# Patient Record
Sex: Female | Born: 1945 | Race: White | Hispanic: No | Smoking: Former smoker
Health system: Southern US, Community
[De-identification: ages and names within clinical notes are randomized; demographics above are authoritative.]

## PROBLEM LIST (undated history)

## (undated) DIAGNOSIS — M810 Age-related osteoporosis without current pathological fracture: Secondary | ICD-10-CM

## (undated) DIAGNOSIS — I499 Cardiac arrhythmia, unspecified: Secondary | ICD-10-CM

## (undated) DIAGNOSIS — J309 Allergic rhinitis, unspecified: Secondary | ICD-10-CM

## (undated) DIAGNOSIS — J439 Emphysema, unspecified: Secondary | ICD-10-CM

## (undated) DIAGNOSIS — J449 Chronic obstructive pulmonary disease, unspecified: Secondary | ICD-10-CM

## (undated) DIAGNOSIS — K219 Gastro-esophageal reflux disease without esophagitis: Secondary | ICD-10-CM

## (undated) DIAGNOSIS — F32A Depression, unspecified: Secondary | ICD-10-CM

## (undated) DIAGNOSIS — F419 Anxiety disorder, unspecified: Secondary | ICD-10-CM

## (undated) DIAGNOSIS — E079 Disorder of thyroid, unspecified: Secondary | ICD-10-CM

## (undated) DIAGNOSIS — J45909 Unspecified asthma, uncomplicated: Secondary | ICD-10-CM

## (undated) DIAGNOSIS — C50919 Malignant neoplasm of unspecified site of unspecified female breast: Secondary | ICD-10-CM

## (undated) DIAGNOSIS — K5790 Diverticulosis of intestine, part unspecified, without perforation or abscess without bleeding: Secondary | ICD-10-CM

## (undated) DIAGNOSIS — E039 Hypothyroidism, unspecified: Secondary | ICD-10-CM

## (undated) DIAGNOSIS — Z923 Personal history of irradiation: Secondary | ICD-10-CM

## (undated) DIAGNOSIS — M1990 Unspecified osteoarthritis, unspecified site: Secondary | ICD-10-CM

## (undated) DIAGNOSIS — E7800 Pure hypercholesterolemia, unspecified: Secondary | ICD-10-CM

## (undated) DIAGNOSIS — I10 Essential (primary) hypertension: Secondary | ICD-10-CM

## (undated) DIAGNOSIS — D649 Anemia, unspecified: Secondary | ICD-10-CM

## (undated) HISTORY — PX: OTHER SURGICAL HISTORY: SHX169

## (undated) HISTORY — PX: CATARACT EXTRACTION: SUR2

## (undated) HISTORY — PX: MASTECTOMY PARTIAL / LUMPECTOMY: SUR851

## (undated) HISTORY — PX: EYE SURGERY: SHX253

## (undated) HISTORY — PX: TUBAL LIGATION: SHX77

## (undated) HISTORY — PX: TONSILLECTOMY: SUR1361

## (undated) HISTORY — PX: CHOLECYSTECTOMY: SHX55

## (undated) HISTORY — PX: ABDOMINAL HYSTERECTOMY: SHX81

---

## 2001-02-07 ENCOUNTER — Ambulatory Visit (HOSPITAL_BASED_OUTPATIENT_CLINIC_OR_DEPARTMENT_OTHER): Admission: RE | Admit: 2001-02-07 | Discharge: 2001-02-07 | Payer: Self-pay | Admitting: Neurosurgery

## 2001-08-01 ENCOUNTER — Ambulatory Visit (HOSPITAL_BASED_OUTPATIENT_CLINIC_OR_DEPARTMENT_OTHER): Admission: RE | Admit: 2001-08-01 | Discharge: 2001-08-01 | Payer: Self-pay | Admitting: Neurosurgery

## 2012-10-30 DIAGNOSIS — Z923 Personal history of irradiation: Secondary | ICD-10-CM

## 2012-10-30 DIAGNOSIS — C50919 Malignant neoplasm of unspecified site of unspecified female breast: Secondary | ICD-10-CM

## 2012-10-30 HISTORY — PX: BREAST BIOPSY: SHX20

## 2012-10-30 HISTORY — PX: BREAST LUMPECTOMY: SHX2

## 2012-10-30 HISTORY — DX: Malignant neoplasm of unspecified site of unspecified female breast: C50.919

## 2012-10-30 HISTORY — DX: Personal history of irradiation: Z92.3

## 2018-10-02 ENCOUNTER — Telehealth: Payer: Self-pay | Admitting: Family Medicine

## 2018-10-02 NOTE — Telephone Encounter (Signed)
I would recommend Dr. Jacinto Reap. My panel is full

## 2018-10-02 NOTE — Telephone Encounter (Signed)
Kara Bryant was a patient of yours 16 years ago and she is moving back to [place] from [place] and wants to know will you take her back as a patient.

## 2018-10-02 NOTE — Telephone Encounter (Signed)
Please review. KW 

## 2018-11-20 ENCOUNTER — Other Ambulatory Visit: Payer: Self-pay | Admitting: Physician Assistant

## 2018-11-20 DIAGNOSIS — Z1231 Encounter for screening mammogram for malignant neoplasm of breast: Secondary | ICD-10-CM

## 2018-12-02 ENCOUNTER — Ambulatory Visit
Admission: RE | Admit: 2018-12-02 | Discharge: 2018-12-02 | Disposition: A | Discharge status: Home / Self Care | Payer: Medicare Other | Source: Ambulatory Visit | Attending: Physician Assistant | Admitting: Physician Assistant

## 2018-12-02 DIAGNOSIS — Z1231 Encounter for screening mammogram for malignant neoplasm of breast: Secondary | ICD-10-CM | POA: Diagnosis present

## 2018-12-02 HISTORY — DX: Personal history of irradiation: Z92.3

## 2018-12-02 HISTORY — DX: Malignant neoplasm of unspecified site of unspecified female breast: C50.919

## 2019-06-13 ENCOUNTER — Ambulatory Visit
Admission: RE | Admit: 2019-06-13 | Discharge: 2019-06-13 | Disposition: A | Discharge status: Home / Self Care | Payer: Medicare Other | Source: Ambulatory Visit | Attending: Physician Assistant | Admitting: Physician Assistant

## 2019-06-13 ENCOUNTER — Other Ambulatory Visit: Payer: Self-pay | Admitting: Physician Assistant

## 2019-06-13 ENCOUNTER — Other Ambulatory Visit
Admission: RE | Admit: 2019-06-13 | Discharge: 2019-06-13 | Disposition: A | Discharge status: Home / Self Care | Payer: Medicare Other | Source: Ambulatory Visit | Attending: Physician Assistant | Admitting: Physician Assistant

## 2019-06-13 ENCOUNTER — Other Ambulatory Visit: Payer: Self-pay

## 2019-06-13 DIAGNOSIS — R0602 Shortness of breath: Secondary | ICD-10-CM | POA: Insufficient documentation

## 2019-06-13 DIAGNOSIS — R05 Cough: Secondary | ICD-10-CM | POA: Insufficient documentation

## 2019-06-13 DIAGNOSIS — R053 Chronic cough: Secondary | ICD-10-CM

## 2019-06-13 DIAGNOSIS — R071 Chest pain on breathing: Secondary | ICD-10-CM

## 2019-06-13 HISTORY — DX: Unspecified asthma, uncomplicated: J45.909

## 2019-06-13 HISTORY — DX: Essential (primary) hypertension: I10

## 2019-06-13 LAB — POCT I-STAT CREATININE: Creatinine, Ser: 1 mg/dL (ref 0.44–1.00)

## 2019-06-13 LAB — BRAIN NATRIURETIC PEPTIDE: B Natriuretic Peptide: 103 pg/mL — ABNORMAL HIGH (ref 0.0–100.0)

## 2019-06-13 MED ORDER — IOHEXOL 350 MG/ML SOLN
75.0000 mL | Freq: Once | INTRAVENOUS | Status: AC | PRN
  Administered 2019-06-13: 17:00:00 75 mL via INTRAVENOUS

## 2019-12-12 ENCOUNTER — Other Ambulatory Visit: Payer: Self-pay | Admitting: Physician Assistant

## 2019-12-12 DIAGNOSIS — Z1231 Encounter for screening mammogram for malignant neoplasm of breast: Secondary | ICD-10-CM

## 2020-01-15 IMAGING — MG DIGITAL SCREENING BILATERAL MAMMOGRAM WITH TOMO AND CAD
8 series · 8 of 24 positions shown · non-contrast
Comparison: Previous exam(s).

CLINICAL DATA: Screening.

EXAM:
DIGITAL SCREENING BILATERAL MAMMOGRAM WITH TOMO AND CAD

[R MLO synth-2D]
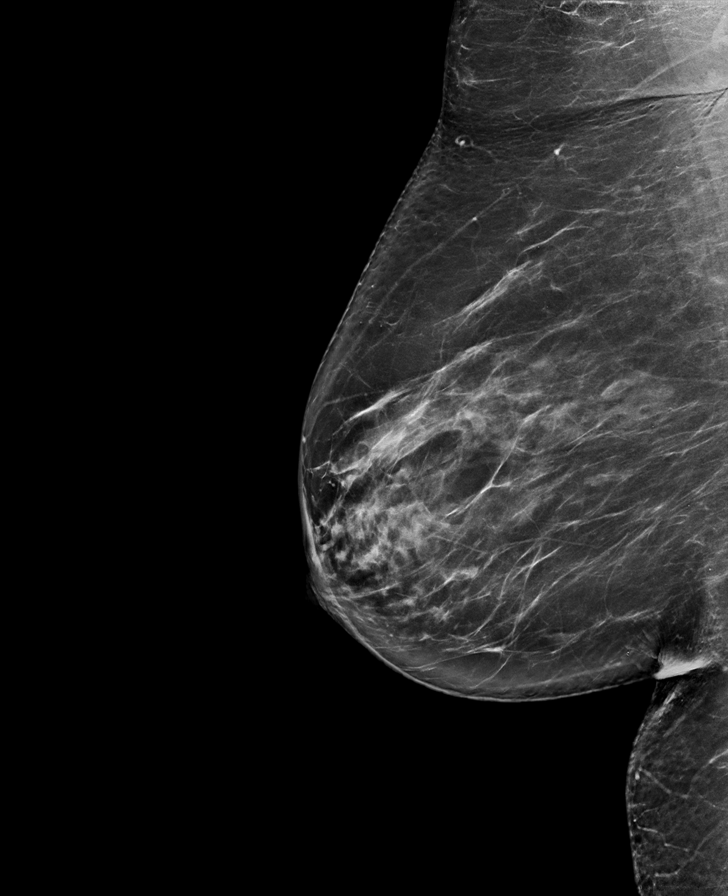

[L MLO synth-2D]
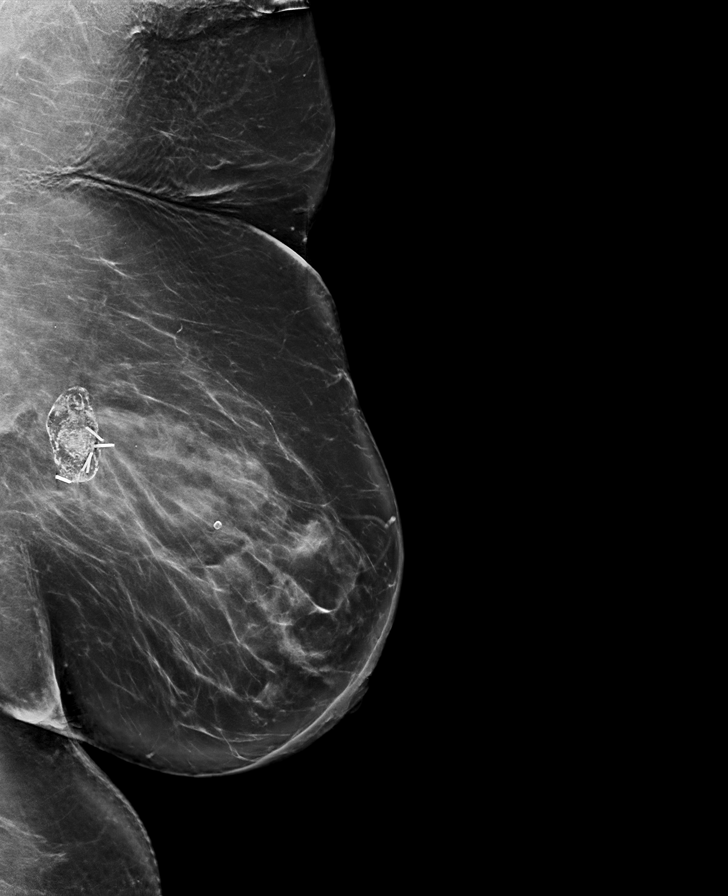

[R CC synth-2D]
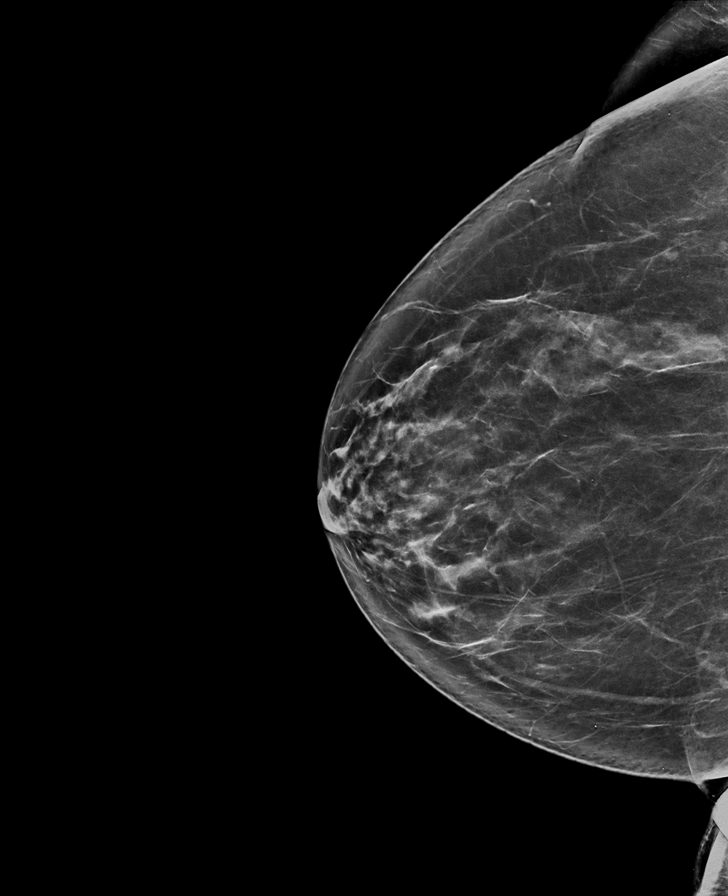

[L CC synth-2D]
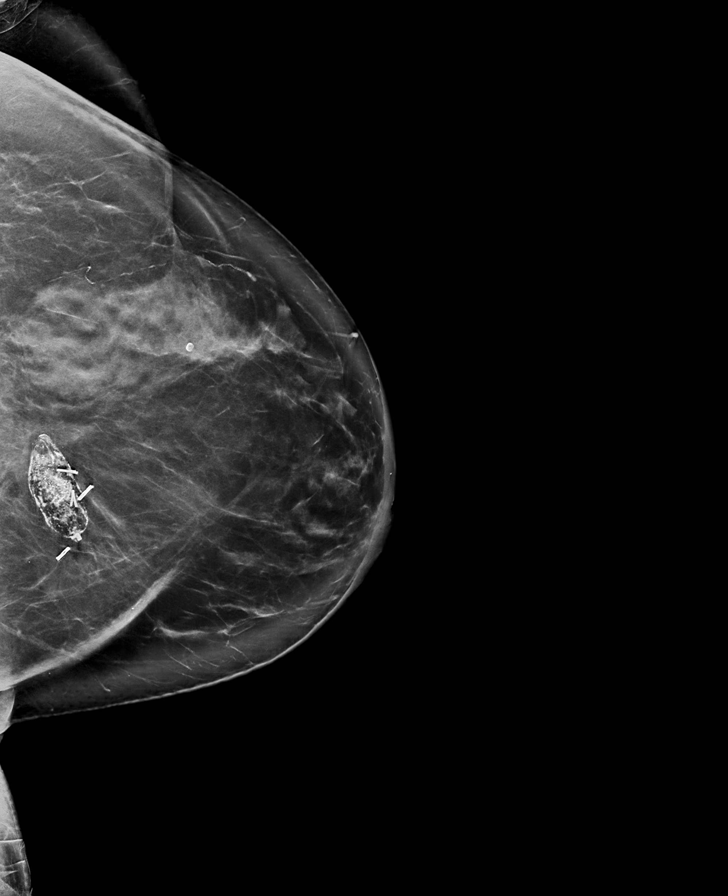

[L MLO tomo · tomo slice 51/101.0]
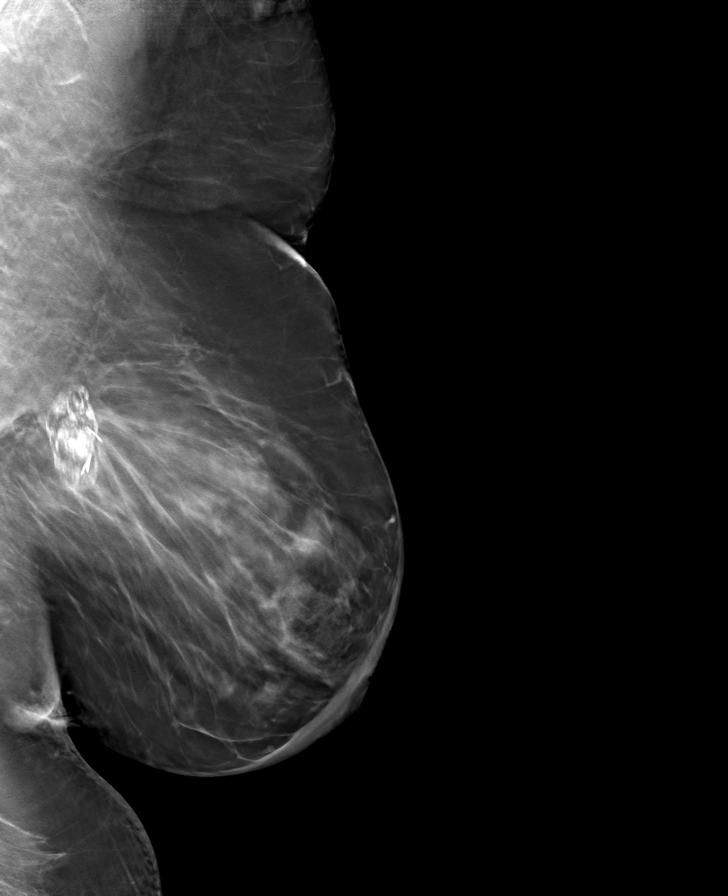

[R MLO tomo · tomo slice 43/86.0]
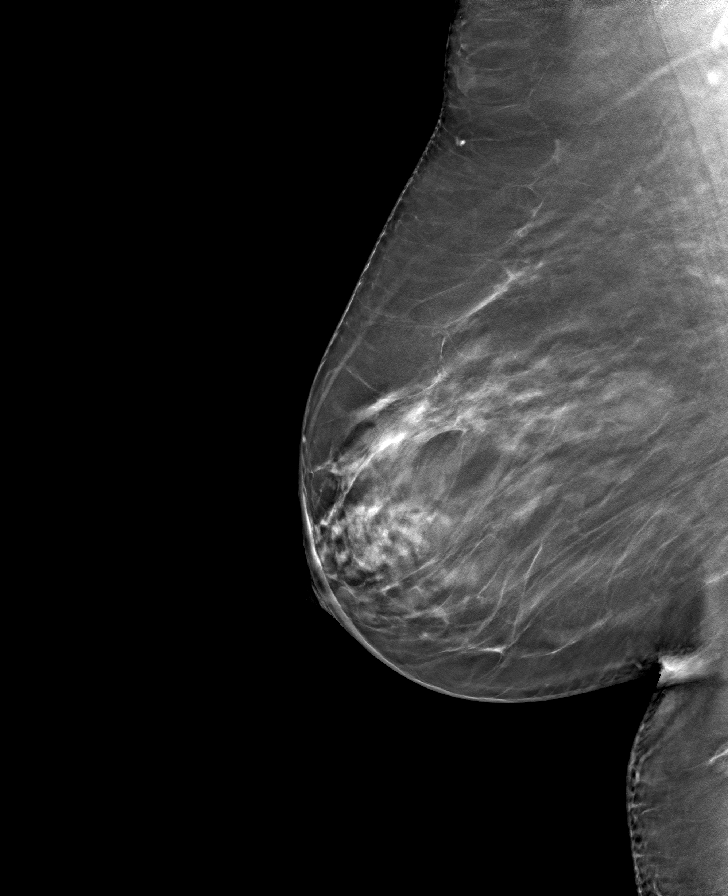

[R CC tomo · tomo slice 40/79.0]
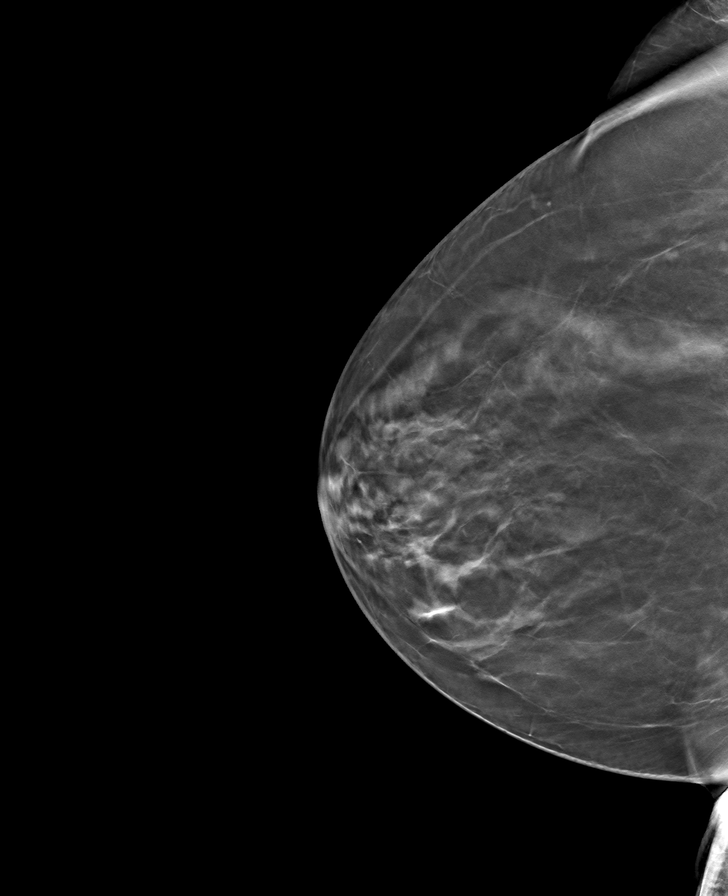

[L CC tomo · tomo slice 49/97.0]
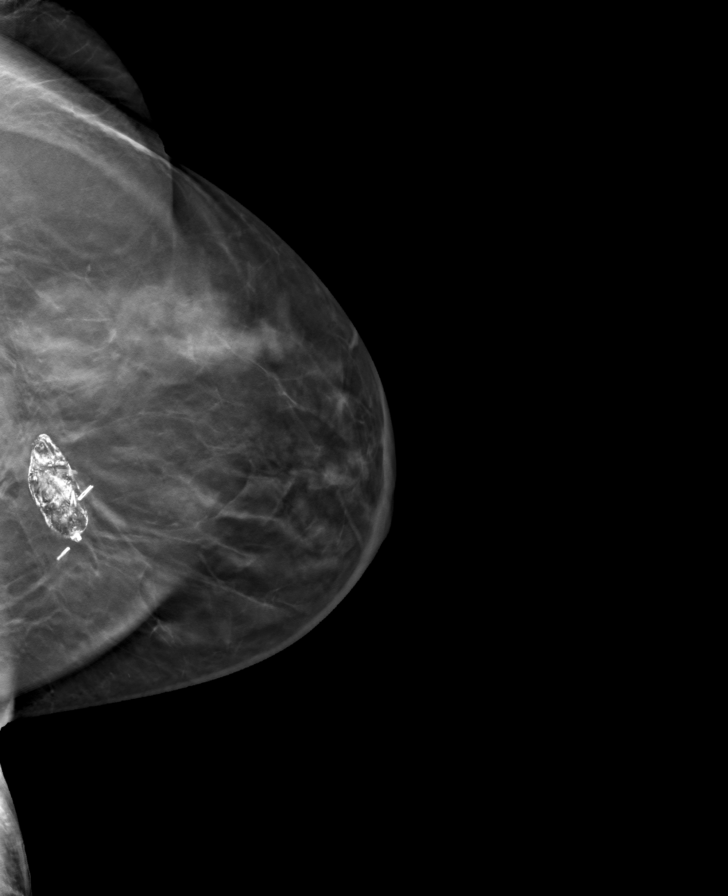

[8 of 24 positions shown; findings below may reference images not displayed]

ACR Breast Density Category c: The breast tissue is heterogeneously
dense, which may obscure small masses.
FINDINGS: There are no findings suspicious for malignancy. Images were
processed with CAD.
IMPRESSION: No mammographic evidence of malignancy. A result letter of this
screening mammogram will be mailed directly to the patient.

RECOMMENDATION:
Screening mammogram in one year. (Code:FT-U-LHB)

BI-RADS CATEGORY  1: Negative.

## 2020-01-30 ENCOUNTER — Ambulatory Visit
Admission: RE | Admit: 2020-01-30 | Discharge: 2020-01-30 | Disposition: A | Discharge status: Home / Self Care | Payer: Medicare Other | Source: Ambulatory Visit | Attending: Physician Assistant | Admitting: Physician Assistant

## 2020-01-30 DIAGNOSIS — Z1231 Encounter for screening mammogram for malignant neoplasm of breast: Secondary | ICD-10-CM

## 2020-11-04 ENCOUNTER — Other Ambulatory Visit: Payer: Self-pay | Admitting: Physician Assistant

## 2020-11-04 ENCOUNTER — Other Ambulatory Visit: Payer: Self-pay | Admitting: Internal Medicine

## 2020-11-04 DIAGNOSIS — Z1231 Encounter for screening mammogram for malignant neoplasm of breast: Secondary | ICD-10-CM

## 2021-02-01 ENCOUNTER — Other Ambulatory Visit: Payer: Self-pay

## 2021-02-01 ENCOUNTER — Ambulatory Visit
Admission: RE | Admit: 2021-02-01 | Discharge: 2021-02-01 | Disposition: A | Discharge status: Home / Self Care | Payer: Medicare HMO | Source: Ambulatory Visit | Attending: Internal Medicine | Admitting: Internal Medicine

## 2021-02-01 DIAGNOSIS — Z1231 Encounter for screening mammogram for malignant neoplasm of breast: Secondary | ICD-10-CM | POA: Diagnosis present

## 2021-06-26 ENCOUNTER — Other Ambulatory Visit: Payer: Self-pay

## 2021-06-26 ENCOUNTER — Emergency Department
Admission: EM | Admit: 2021-06-26 | Discharge: 2021-06-26 | Disposition: A | Discharge status: Home / Self Care | Payer: Medicare HMO | Attending: Emergency Medicine | Admitting: Emergency Medicine

## 2021-06-26 ENCOUNTER — Emergency Department: Payer: Medicare HMO

## 2021-06-26 ENCOUNTER — Encounter: Payer: Self-pay | Admitting: Emergency Medicine

## 2021-06-26 DIAGNOSIS — E860 Dehydration: Secondary | ICD-10-CM

## 2021-06-26 DIAGNOSIS — J45909 Unspecified asthma, uncomplicated: Secondary | ICD-10-CM | POA: Diagnosis not present

## 2021-06-26 DIAGNOSIS — R112 Nausea with vomiting, unspecified: Secondary | ICD-10-CM | POA: Insufficient documentation

## 2021-06-26 DIAGNOSIS — U071 COVID-19: Secondary | ICD-10-CM | POA: Diagnosis not present

## 2021-06-26 DIAGNOSIS — Z853 Personal history of malignant neoplasm of breast: Secondary | ICD-10-CM | POA: Diagnosis not present

## 2021-06-26 DIAGNOSIS — R509 Fever, unspecified: Secondary | ICD-10-CM | POA: Diagnosis present

## 2021-06-26 DIAGNOSIS — I10 Essential (primary) hypertension: Secondary | ICD-10-CM | POA: Insufficient documentation

## 2021-06-26 DIAGNOSIS — E871 Hypo-osmolality and hyponatremia: Secondary | ICD-10-CM | POA: Insufficient documentation

## 2021-06-26 LAB — CBC
HCT: 33.5 % — ABNORMAL LOW (ref 36.0–46.0)
Hemoglobin: 11.8 g/dL — ABNORMAL LOW (ref 12.0–15.0)
MCH: 30.1 pg (ref 26.0–34.0)
MCHC: 35.2 g/dL (ref 30.0–36.0)
MCV: 85.5 fL (ref 80.0–100.0)
Platelets: 111 10*3/uL — ABNORMAL LOW (ref 150–400)
RBC: 3.92 MIL/uL (ref 3.87–5.11)
RDW: 13.3 % (ref 11.5–15.5)
WBC: 3.7 10*3/uL — ABNORMAL LOW (ref 4.0–10.5)
nRBC: 0 % (ref 0.0–0.2)

## 2021-06-26 LAB — URINALYSIS, COMPLETE (UACMP) WITH MICROSCOPIC
Bilirubin Urine: NEGATIVE
Glucose, UA: NEGATIVE mg/dL
Hgb urine dipstick: NEGATIVE
Ketones, ur: 20 mg/dL — AB
Leukocytes,Ua: NEGATIVE
Nitrite: NEGATIVE
Protein, ur: 30 mg/dL — AB
Specific Gravity, Urine: 1.013 (ref 1.005–1.030)
pH: 5 (ref 5.0–8.0)

## 2021-06-26 LAB — TROPONIN I (HIGH SENSITIVITY): Troponin I (High Sensitivity): 10 ng/L (ref <18)

## 2021-06-26 LAB — COMPREHENSIVE METABOLIC PANEL
ALT: 33 U/L (ref 0–44)
AST: 51 U/L — ABNORMAL HIGH (ref 15–41)
Albumin: 4.1 g/dL (ref 3.5–5.0)
Alkaline Phosphatase: 57 U/L (ref 38–126)
Anion gap: 9 (ref 5–15)
BUN: 12 mg/dL (ref 8–23)
CO2: 24 mmol/L (ref 22–32)
Calcium: 8.3 mg/dL — ABNORMAL LOW (ref 8.9–10.3)
Chloride: 95 mmol/L — ABNORMAL LOW (ref 98–111)
Creatinine, Ser: 0.87 mg/dL (ref 0.44–1.00)
GFR, Estimated: >60 mL/min (ref >60)
Glucose, Bld: 107 mg/dL — ABNORMAL HIGH (ref 70–99)
Potassium: 3.7 mmol/L (ref 3.5–5.1)
Sodium: 128 mmol/L — ABNORMAL LOW (ref 135–145)
Total Bilirubin: 0.8 mg/dL (ref 0.3–1.2)
Total Protein: 7.2 g/dL (ref 6.5–8.1)

## 2021-06-26 LAB — LIPASE, BLOOD: Lipase: 34 U/L (ref 11–51)

## 2021-06-26 MED ORDER — KETOROLAC TROMETHAMINE 30 MG/ML IJ SOLN
15.0000 mg | Freq: Once | INTRAMUSCULAR | Status: AC
  Administered 2021-06-26: 15 mg via INTRAVENOUS
  Filled 2021-06-26: qty 1

## 2021-06-26 MED ORDER — SODIUM CHLORIDE 0.9 % IV BOLUS
1000.0000 mL | Freq: Once | INTRAVENOUS | Status: AC
  Administered 2021-06-26: 1000 mL via INTRAVENOUS

## 2021-06-26 MED ORDER — ONDANSETRON HCL 4 MG/2ML IJ SOLN
4.0000 mg | Freq: Once | INTRAMUSCULAR | Status: AC
  Administered 2021-06-26: 4 mg via INTRAVENOUS
  Filled 2021-06-26: qty 2

## 2021-06-26 MED ORDER — ONDANSETRON 4 MG PO TBDP: 4.0000 mg | ORAL_TABLET | Freq: Three times a day (TID) | ORAL | 0 refills | Status: AC | PRN

## 2021-06-26 NOTE — ED Triage Notes (Signed)
Pt reports tested positive for COVID Friday and has had NVD since then. Pt denies CP, cough or SOB. Pt reports has also ran a fever.

## 2021-06-26 NOTE — ED Provider Notes (Signed)
[facility] Emergency Department Provider Note  Time seen: 4:50 PM  I have reviewed the triage vital signs and the nursing notes.   HISTORY  Chief Complaint Nausea, Emesis, Diarrhea, and Fever   HPI Kara Bryant is a 75 y.o. female with a past medical history of asthma, hypertension, left breast cancer, presents emergency department for fatigue nausea vomiting diarrhea fever.  According to the patient for the past 8 or 9 days she has been experiencing headache intermittent fever cough nausea vomiting and diarrhea.  States she took a COVID test 2 days ago that was positive.  Patient has not vomited in 2 days however states she has not been able to eat much due to nausea.  Came to the emergency department for evaluation. Past Medical History:  Diagnosis Date   Asthma    Breast cancer ([place]) 2014   left breast cancer   Hypertension    Personal history of radiation therapy 2014   Left breast ca    There are no problems to display for this patient.   Past Surgical History:  Procedure Laterality Date   BREAST BIOPSY Left 2014   positive   BREAST LUMPECTOMY Left 2014   breast ca    Prior to Admission medications   Not on File    Not on File  Family History  Problem Relation Age of Onset   Breast cancer Maternal Aunt        >50   Breast cancer Maternal Grandmother        >50    Social History    Review of Systems Constitutional: Initially subjective fever but none recently per patient. Cardiovascular: Negative for chest pain. Respiratory: Negative for shortness of breath.  Positive for occasional cough. Gastrointestinal: Negative for abdominal pain.  Positive for vomiting and diarrhea although they stopped approximately 2 days ago.  Continues to have nausea. Genitourinary: Negative for urinary compaints Musculoskeletal: Negative for musculoskeletal complaints Neurological: Negative for headache All other ROS  negative  ____________________________________________   PHYSICAL EXAM:  VITAL SIGNS: ED Triage Vitals  Enc Vitals Group     BP 06/26/21 1128 (!) 134/107     Pulse Rate 06/26/21 1128 89     Resp 06/26/21 1128 18     Temp 06/26/21 1128 98.6 F (37 C)     Temp Source 06/26/21 1128 Oral     SpO2 06/26/21 1128 95 %     Weight 06/26/21 1129 217 lb (98.4 kg)     Height 06/26/21 1129 '5\' 4"'$  (1.626 m)     Head Circumference --      Peak Flow --      Pain Score 06/26/21 1056 0     Pain Loc --      Pain Edu? --      Excl. in [place]? --    Constitutional: Alert and oriented. Well appearing and in no distress. Eyes: Normal exam ENT      Head: Normocephalic and atraumatic.      Mouth/Throat: Mucous membranes are moist. Cardiovascular: Normal rate, regular rhythm.  Respiratory: Normal respiratory effort without tachypnea nor retractions. Breath sounds are clear  Gastrointestinal: Soft and nontender. No distention.   Musculoskeletal: Nontender with normal range of motion in all extremities. Neurologic:  Normal speech and language. No gross focal neurologic deficits Skin:  Skin is warm, dry and intact.  Psychiatric: Mood and affect are normal.   ____________________________________________   RADIOLOGY  Chest x-ray negative  ____________________________________________   INITIAL  IMPRESSION / ASSESSMENT AND PLAN / ED COURSE  Pertinent labs & imaging results that were available during my care of the patient were reviewed by me and considered in my medical decision making (see chart for details).   Patient presents emergency department for continued nausea, recently tested positive for COVID, fever headache vomiting and diarrhea have since subsided but continues to have a cough.  Patient's lab work does show mild hyponatremia at 128, otherwise well-appearing labs.  We will IV hydrate with normal saline we will treat nausea with Zofran and dose Toradol as well.  We will obtain a chest  x-ray to evaluate.  Since symptoms have been ongoing for over 1 week I do not believe antivirals would be of much benefit to the patient at this point.  Patient states she is feeling much better after fluids Toradol and Zofran.  Chest x-ray is clear.  Urinalysis shows a slight amount of ketones.  As the patient is feeling better I believe she would be safe for discharge home with Zofran to be used at home as needed.  Discussed with the patient increasing oral intake at home and following up with her doctor.  Also discussed return precautions if she is unable to tolerate food or drink at home.  Patient agreeable to plan of care.  Kara Bryant was evaluated in Emergency Department on 06/26/2021 for the symptoms described in the history of present illness. She was evaluated in the context of the global COVID-19 pandemic, which necessitated consideration that the patient might be at risk for infection with the SARS-CoV-2 virus that causes COVID-19. Institutional protocols and algorithms that pertain to the evaluation of patients at risk for COVID-19 are in a state of rapid change based on information released by regulatory bodies including the CDC and federal and state organizations. These policies and algorithms were followed during the patient's care in the ED.  ____________________________________________   FINAL CLINICAL IMPRESSION(S) / ED DIAGNOSES  COVID-19 Nausea vomiting Hyponatremia   Harvest Dark, MD 06/26/21 [phone]

## 2021-08-23 ENCOUNTER — Other Ambulatory Visit: Payer: Self-pay | Admitting: Internal Medicine

## 2021-08-23 DIAGNOSIS — Z1239 Encounter for other screening for malignant neoplasm of breast: Secondary | ICD-10-CM

## 2021-09-05 ENCOUNTER — Ambulatory Visit: Admission: RE | Admit: 2021-09-05 | Payer: Medicare HMO | Source: Ambulatory Visit

## 2022-01-23 ENCOUNTER — Encounter: Payer: Self-pay | Admitting: Emergency Medicine

## 2022-01-23 ENCOUNTER — Emergency Department
Admission: EM | Admit: 2022-01-23 | Discharge: 2022-01-23 | Discharge status: Against Medical Advice | Payer: Medicare HMO | Attending: Emergency Medicine | Admitting: Emergency Medicine

## 2022-01-23 DIAGNOSIS — R109 Unspecified abdominal pain: Secondary | ICD-10-CM | POA: Diagnosis not present

## 2022-01-23 DIAGNOSIS — R197 Diarrhea, unspecified: Secondary | ICD-10-CM | POA: Diagnosis not present

## 2022-01-23 DIAGNOSIS — Z5321 Procedure and treatment not carried out due to patient leaving prior to being seen by health care provider: Secondary | ICD-10-CM | POA: Insufficient documentation

## 2022-01-23 NOTE — ED Notes (Signed)
No answer when called several times from lobby 

## 2022-01-23 NOTE — ED Notes (Signed)
Called several times from lobby with no answer ?

## 2022-01-23 NOTE — ED Triage Notes (Signed)
C/O abdominal pain since Thursday.  Also having some diarrhea today. ?

## 2022-01-23 NOTE — ED Notes (Signed)
Called several times from lobby with no answer; called phone # listed in chart & pt st already left ?

## 2022-01-24 ENCOUNTER — Other Ambulatory Visit (HOSPITAL_COMMUNITY): Payer: Self-pay | Admitting: Family Medicine

## 2022-01-24 ENCOUNTER — Other Ambulatory Visit: Payer: Self-pay

## 2022-01-24 ENCOUNTER — Ambulatory Visit
Admission: RE | Admit: 2022-01-24 | Discharge: 2022-01-24 | Disposition: A | Discharge status: Home / Self Care | Payer: Medicare HMO | Source: Ambulatory Visit | Attending: Family Medicine | Admitting: Family Medicine

## 2022-01-24 ENCOUNTER — Other Ambulatory Visit: Payer: Self-pay | Admitting: Family Medicine

## 2022-01-24 DIAGNOSIS — R1032 Left lower quadrant pain: Secondary | ICD-10-CM | POA: Insufficient documentation

## 2022-01-24 DIAGNOSIS — R110 Nausea: Secondary | ICD-10-CM

## 2022-01-24 LAB — POCT I-STAT CREATININE: Creatinine, Ser: 1.1 mg/dL — ABNORMAL HIGH (ref 0.44–1.00)

## 2022-01-24 MED ORDER — IOHEXOL 300 MG/ML  SOLN
100.0000 mL | Freq: Once | INTRAMUSCULAR | Status: AC | PRN
  Administered 2022-01-24: 100 mL via INTRAVENOUS

## 2022-05-11 ENCOUNTER — Encounter: Payer: Self-pay | Admitting: *Deleted

## 2022-05-12 ENCOUNTER — Ambulatory Visit
Admission: RE | Admit: 2022-05-12 | Discharge: 2022-05-12 | Disposition: A | Discharge status: Home / Self Care | Payer: Medicare HMO | Attending: Gastroenterology | Admitting: Gastroenterology

## 2022-05-12 ENCOUNTER — Ambulatory Visit: Payer: Medicare HMO | Admitting: Anesthesiology

## 2022-05-12 ENCOUNTER — Encounter
Admission: RE | Disposition: A | Discharge status: Home / Self Care | Payer: Self-pay | Source: Home / Self Care | Attending: Gastroenterology

## 2022-05-12 ENCOUNTER — Other Ambulatory Visit: Payer: Self-pay

## 2022-05-12 ENCOUNTER — Encounter: Payer: Self-pay | Admitting: *Deleted

## 2022-05-12 DIAGNOSIS — K5730 Diverticulosis of large intestine without perforation or abscess without bleeding: Secondary | ICD-10-CM | POA: Insufficient documentation

## 2022-05-12 DIAGNOSIS — Z853 Personal history of malignant neoplasm of breast: Secondary | ICD-10-CM | POA: Insufficient documentation

## 2022-05-12 DIAGNOSIS — F419 Anxiety disorder, unspecified: Secondary | ICD-10-CM | POA: Diagnosis not present

## 2022-05-12 DIAGNOSIS — Z923 Personal history of irradiation: Secondary | ICD-10-CM | POA: Diagnosis not present

## 2022-05-12 DIAGNOSIS — F32A Depression, unspecified: Secondary | ICD-10-CM | POA: Insufficient documentation

## 2022-05-12 DIAGNOSIS — K640 First degree hemorrhoids: Secondary | ICD-10-CM | POA: Insufficient documentation

## 2022-05-12 DIAGNOSIS — I10 Essential (primary) hypertension: Secondary | ICD-10-CM | POA: Diagnosis not present

## 2022-05-12 DIAGNOSIS — E7800 Pure hypercholesterolemia, unspecified: Secondary | ICD-10-CM | POA: Insufficient documentation

## 2022-05-12 DIAGNOSIS — Z09 Encounter for follow-up examination after completed treatment for conditions other than malignant neoplasm: Secondary | ICD-10-CM | POA: Insufficient documentation

## 2022-05-12 DIAGNOSIS — K219 Gastro-esophageal reflux disease without esophagitis: Secondary | ICD-10-CM | POA: Insufficient documentation

## 2022-05-12 DIAGNOSIS — Z87891 Personal history of nicotine dependence: Secondary | ICD-10-CM | POA: Insufficient documentation

## 2022-05-12 DIAGNOSIS — D122 Benign neoplasm of ascending colon: Secondary | ICD-10-CM | POA: Insufficient documentation

## 2022-05-12 DIAGNOSIS — J439 Emphysema, unspecified: Secondary | ICD-10-CM | POA: Diagnosis not present

## 2022-05-12 HISTORY — DX: Gastro-esophageal reflux disease without esophagitis: K21.9

## 2022-05-12 HISTORY — DX: Anemia, unspecified: D64.9

## 2022-05-12 HISTORY — DX: Emphysema, unspecified: J43.9

## 2022-05-12 HISTORY — DX: Anxiety disorder, unspecified: F41.9

## 2022-05-12 HISTORY — DX: Age-related osteoporosis without current pathological fracture: M81.0

## 2022-05-12 HISTORY — DX: Depression, unspecified: F32.A

## 2022-05-12 HISTORY — DX: Disorder of thyroid, unspecified: E07.9

## 2022-05-12 HISTORY — PX: COLONOSCOPY WITH PROPOFOL: SHX5780

## 2022-05-12 HISTORY — DX: Chronic obstructive pulmonary disease, unspecified: J44.9

## 2022-05-12 HISTORY — DX: Unspecified osteoarthritis, unspecified site: M19.90

## 2022-05-12 HISTORY — DX: Pure hypercholesterolemia, unspecified: E78.00

## 2022-05-12 HISTORY — DX: Cardiac arrhythmia, unspecified: I49.9

## 2022-05-12 SURGERY — COLONOSCOPY WITH PROPOFOL
Anesthesia: General

## 2022-05-12 MED ORDER — SODIUM CHLORIDE 0.9 % IV SOLN: INTRAVENOUS | Status: DC

## 2022-05-12 MED ORDER — PROPOFOL 500 MG/50ML IV EMUL
INTRAVENOUS | Status: DC | PRN
  Administered 2022-05-12: 150 ug/kg/min via INTRAVENOUS

## 2022-05-12 MED ORDER — PROPOFOL 10 MG/ML IV BOLUS
INTRAVENOUS | Status: DC | PRN
  Administered 2022-05-12: 80 mg via INTRAVENOUS

## 2022-05-12 MED ORDER — LIDOCAINE HCL (CARDIAC) PF 100 MG/5ML IV SOSY
PREFILLED_SYRINGE | INTRAVENOUS | Status: DC | PRN
  Administered 2022-05-12: 50 mg via INTRAVENOUS

## 2022-05-12 MED ORDER — PHENYLEPHRINE HCL (PRESSORS) 10 MG/ML IV SOLN
INTRAVENOUS | Status: DC | PRN
  Administered 2022-05-12: 80 ug via INTRAVENOUS

## 2022-05-12 NOTE — Anesthesia Preprocedure Evaluation (Addendum)
Anesthesia Evaluation  Patient identified by MRN, date of birth, ID band Patient awake    Reviewed: Allergy & Precautions, NPO status , Patient's Chart, lab work & pertinent test results  History of Anesthesia Complications Negative for: history of anesthetic complications  Airway Mallampati: III  TM Distance: <3 FB Neck ROM: full    Dental  (+) Chipped   Pulmonary neg shortness of breath, asthma , COPD, former smoker,    Pulmonary exam normal        Cardiovascular Exercise Tolerance: Good hypertension, (-) angina(-) DOE Normal cardiovascular exam+ dysrhythmias   Echo 06/27/19:  NORMAL LEFT VENTRICULAR SYSTOLIC FUNCTION  NORMAL RIGHT VENTRICULAR SYSTOLIC FUNCTION  TRIVIAL REGURGITATION NOTED  NO VALVULAR STENOSIS    Neuro/Psych PSYCHIATRIC DISORDERS Anxiety Depression negative neurological ROS     GI/Hepatic Neg liver ROS, GERD  Controlled,  Endo/Other  negative endocrine ROS  Renal/GU negative Renal ROS  negative genitourinary   Musculoskeletal   Abdominal   Peds  Hematology  (+) Blood dyscrasia, anemia , Breast CA   Anesthesia Other Findings Past Medical History: No date: Anemia No date: Anxiety No date: Arthritis No date: Asthma 2014: Breast cancer ([place])     Comment:  left breast cancer No date: COPD (chronic obstructive pulmonary disease) ([facility]) No date: Depression No date: Dysrhythmia No date: Emphysema of lung ([facility]) No date: GERD (gastroesophageal reflux disease) No date: High cholesterol No date: Hypertension No date: Osteoporosis 2014: Personal history of radiation therapy     Comment:  Left breast ca No date: Thyroid disease  Past Surgical History: No date: ABDOMINAL HYSTERECTOMY 2014: BREAST BIOPSY; Left     Comment:  positive 2014: BREAST LUMPECTOMY; Left     Comment:  breast ca No date: CATARACT EXTRACTION No date: CHOLECYSTECTOMY No date: MASTECTOMY PARTIAL / LUMPECTOMY No date:  repair bladder exstrophy No date: TONSILLECTOMY No date: TUBAL LIGATION  BMI    Body Mass Index: 35.70 kg/m      Reproductive/Obstetrics negative OB ROS                            Anesthesia Physical Anesthesia Plan  ASA: 3  Anesthesia Plan: General   Post-op Pain Management:    Induction: Intravenous  PONV Risk Score and Plan: Propofol infusion and TIVA  Airway Management Planned: Natural Airway and Nasal Cannula  Additional Equipment:   Intra-op Plan:   Post-operative Plan:   Informed Consent: I have reviewed the patients History and Physical, chart, labs and discussed the procedure including the risks, benefits and alternatives for the proposed anesthesia with the patient or authorized representative who has indicated his/her understanding and acceptance.     Dental Advisory Given  Plan Discussed with: Anesthesiologist, CRNA and Surgeon  Anesthesia Plan Comments: (Patient consented for risks of anesthesia including but not limited to:  - adverse reactions to medications - risk of airway placement if required - damage to eyes, teeth, lips or other oral mucosa - nerve damage due to positioning  - sore throat or hoarseness - Damage to heart, brain, nerves, lungs, other parts of body or loss of life  Patient voiced understanding.)       Anesthesia Quick Evaluation

## 2022-05-12 NOTE — Anesthesia Procedure Notes (Signed)
Date/Time: 05/12/2022 1:54 PM  Performed by: Johnna Acosta, CRNAPre-anesthesia Checklist: Patient identified, Emergency Drugs available, Suction available, Patient being monitored and Timeout performed Patient Re-evaluated:Patient Re-evaluated prior to induction Oxygen Delivery Method: Nasal cannula Preoxygenation: Pre-oxygenation with 100% oxygen Induction Type: IV induction

## 2022-05-12 NOTE — H&P (Signed)
Outpatient short stay form Pre-procedure 05/12/2022  Kara Rubenstein, MD  Primary Physician: Gladstone Lighter, MD  Reason for visit:  History of diverticulitis  History of present illness:    76 y/o lady with history of hypothyroidism, obesity, and depression here for colonoscopy for history of diverticulitis. No further abdominal pain. Last colonoscopy in 2016 was reportedly normal. No blood thinners. No significant abdominal surgeries. 2nd degree relatives with colon cancer.    Current Facility-Administered Medications:    0.9 %  sodium chloride infusion, , Intravenous, Continuous, Kenedi Cilia, Hilton Cork, MD, Last Rate: 20 mL/hr at 05/12/22 1252, New Bag at 05/12/22 1252  Medications Prior to Admission  Medication Sig Dispense Refill Last Dose   albuterol (VENTOLIN HFA) 108 (90 Base) MCG/ACT inhaler Inhale into the lungs every 6 (six) hours as needed for wheezing or shortness of breath.   Past Month   Calcium-Vitamin D (CALTRATE 600 PLUS-VIT D PO) Take by mouth.   05/09/2022   carvedilol (COREG) 6.25 MG tablet Take 6.25 mg by mouth 2 (two) times daily with a meal.   05/12/2022 at 0300   cetirizine (ZYRTEC) 10 MG tablet Take 10 mg by mouth daily.   05/12/2022 at 0300   Cholecalciferol (VITAMIN D3) 1.25 MG (50000 UT) CAPS Take by mouth daily.   05/09/2022   fluticasone (FLONASE) 50 MCG/ACT nasal spray Place 2 sprays into both nostrils daily.   05/12/2022 at 0300   levothyroxine (SYNTHROID) 50 MCG tablet Take 50 mcg by mouth daily before breakfast.   05/11/2022   montelukast (SINGULAIR) 10 MG tablet Take 10 mg by mouth at bedtime.   05/12/2022 at 0300   Omega-3 Fatty Acids (FISH OIL) 1000 MG CAPS Take 1 capsule by mouth 2 (two) times daily.   05/09/2022   ondansetron (ZOFRAN ODT) 4 MG disintegrating tablet Take 1 tablet (4 mg total) by mouth every 8 (eight) hours as needed for nausea or vomiting. 20 tablet 0 Past Month   pravastatin (PRAVACHOL) 80 MG tablet Take 80 mg by mouth daily.   05/12/2022  at 0300   sertraline (ZOLOFT) 100 MG tablet Take 100 mg by mouth daily.   05/11/2022     Allergies  Allergen Reactions   Bentyl [Dicyclomine]     headache   Naproxen    Prozac [Fluoxetine]     Headache    Tetanus Toxoids      Past Medical History:  Diagnosis Date   Anemia    Anxiety    Arthritis    Asthma    Breast cancer ([place]) 2014   left breast cancer   COPD (chronic obstructive pulmonary disease) ([place])    Depression    Dysrhythmia    Emphysema of lung ([place])    GERD (gastroesophageal reflux disease)    High cholesterol    Hypertension    Osteoporosis    Personal history of radiation therapy 2014   Left breast ca   Thyroid disease     Review of systems:  Otherwise negative.    Physical Exam  Gen: Alert, oriented. Appears stated age.  HEENT: PERRLA. Lungs: No respiratory distress CV: RRR Abd: soft, benign, no masses Ext: No edema    Planned procedures: Proceed with colonoscopy. The patient understands the nature of the planned procedure, indications, risks, alternatives and potential complications including but not limited to bleeding, infection, perforation, damage to internal organs and possible oversedation/side effects from anesthesia. The patient agrees and gives consent to proceed.  Please refer to procedure notes for findings,  recommendations and patient disposition/instructions.     Kara Rubenstein, MD [facility]

## 2022-05-12 NOTE — Op Note (Signed)
[facility] Patient Name: Kara Bryant Procedure Date: 05/12/2022 1:34 PM MRN: [id number] Account #: [id number] Date of Birth: Jan 09, 1946 Admit Type: Outpatient Age: 76 Room: [facility] ENDO ROOM 3 Gender: Female Note Status: Finalized Instrument Name: Colonoscope [id number] Procedure:             Colonoscopy Indications:           Follow-up of diverticulitis Providers:             Andrey Farmer MD, MD Medicines:             Monitored Anesthesia Care Complications:         No immediate complications. Estimated blood loss:                         Minimal. Procedure:             Pre-Anesthesia Assessment:                        - Prior to the procedure, a History and Physical was                         performed, and patient medications and allergies were                         reviewed. The patient is competent. The risks and                         benefits of the procedure and the sedation options and                         risks were discussed with the patient. All questions                         were answered and informed consent was obtained.                         Patient identification and proposed procedure were                         verified by the physician, the nurse, the                         anesthesiologist, the anesthetist and the technician                         in the endoscopy suite. Mental Status Examination:                         alert and oriented. Airway Examination: normal                         oropharyngeal airway and neck mobility. Respiratory                         Examination: clear to auscultation. CV Examination:                         normal. Prophylactic Antibiotics: The patient does not  require prophylactic antibiotics. Prior                         Anticoagulants: The patient has taken no previous                         anticoagulant or antiplatelet agents. ASA Grade                          Assessment: II - A patient with mild systemic disease.                         After reviewing the risks and benefits, the patient                         was deemed in satisfactory condition to undergo the                         procedure. The anesthesia plan was to use monitored                         anesthesia care (MAC). Immediately prior to                         administration of medications, the patient was                         re-assessed for adequacy to receive sedatives. The                         heart rate, respiratory rate, oxygen saturations,                         blood pressure, adequacy of pulmonary ventilation, and                         response to care were monitored throughout the                         procedure. The physical status of the patient was                         re-assessed after the procedure.                        After obtaining informed consent, the colonoscope was                         passed under direct vision. Throughout the procedure,                         the patient's blood pressure, pulse, and oxygen                         saturations were monitored continuously. The                         Colonoscope was introduced through the anus and  advanced to the the terminal ileum. The colonoscopy                         was performed without difficulty. The patient                         tolerated the procedure well. The quality of the bowel                         preparation was fair. Findings:      The perianal and digital rectal examinations were normal.      The terminal ileum appeared normal.      Two sessile polyps were found in the ascending colon. The polyps were 3       to 5 mm in size. These polyps were removed with a cold snare. Resection       and retrieval were complete. Estimated blood loss was minimal.      A 1 mm polyp was found in the distal ascending colon. The polyp was        sessile. The polyp was removed with a jumbo cold forceps. Resection and       retrieval were complete. Estimated blood loss was minimal.      Multiple small-mouthed diverticula were found in the sigmoid colon,       descending colon and ascending colon.      Internal hemorrhoids were found during retroflexion. The hemorrhoids       were Grade I (internal hemorrhoids that do not prolapse).      The exam was otherwise without abnormality on direct and retroflexion       views. Impression:            - Preparation of the colon was fair.                        - The examined portion of the ileum was normal.                        - Two 3 to 5 mm polyps in the ascending colon, removed                         with a cold snare. Resected and retrieved.                        - One 1 mm polyp in the distal ascending colon,                         removed with a jumbo cold forceps. Resected and                         retrieved.                        - Diverticulosis in the sigmoid colon, in the                         descending colon and in the ascending colon.                        - Internal hemorrhoids.                        -  The examination was otherwise normal on direct and                         retroflexion views. Recommendation:        - Discharge patient to home.                        - Resume previous diet.                        - Continue present medications.                        - Await pathology results.                        - Repeat colonoscopy in 1-2 years because the bowel                         preparation was suboptimal.                        - Return to referring physician as previously                         scheduled. Procedure Code(s):     --- Professional ---                        [phone], Colonoscopy, flexible; with removal of                         tumor(s), polyp(s), or other lesion(s) by snare                         technique                         45380, 8, Colonoscopy, flexible; with biopsy, single                         or multiple Diagnosis Code(s):     --- Professional ---                        K64.0, First degree hemorrhoids                        K63.5, Polyp of colon                        K57.32, Diverticulitis of large intestine without                         perforation or abscess without bleeding                        K57.30, Diverticulosis of large intestine without                         perforation or abscess without bleeding CPT copyright 2019 American Medical Association. All rights reserved. The codes documented in this report are preliminary and upon coder review may  be revised to meet current  compliance requirements. Andrey Farmer MD, MD 05/12/2022 2:17:52 PM Number of Addenda: 0 Note Initiated On: 05/12/2022 1:34 PM Scope Withdrawal Time: 0 hours 10 minutes 46 seconds  Total Procedure Duration: 0 hours 15 minutes 38 seconds  Estimated Blood Loss:  Estimated blood loss was minimal.      [facility]

## 2022-05-12 NOTE — Transfer of Care (Signed)
Immediate Anesthesia Transfer of Care Note  Patient: Kara Bryant  Procedure(s) Performed: COLONOSCOPY WITH PROPOFOL  Patient Location: PACU  Anesthesia Type:General  Level of Consciousness: awake and alert   Airway & Oxygen Therapy: Patient Spontanous Breathing  Post-op Assessment: Report given to RN and Post -op Vital signs reviewed and stable  Post vital signs: Reviewed and stable  Last Vitals:  Vitals Value Taken Time  BP 118/58 05/12/22 1417  Temp 36.1 C 05/12/22 1417  Pulse 77 05/12/22 1417  Resp    SpO2 97 % 05/12/22 1417    Last Pain:  Vitals:   05/12/22 1417  TempSrc: Temporal  PainSc: 0-No pain         Complications: No notable events documented.

## 2022-05-12 NOTE — Interval H&P Note (Signed)
History and Physical Interval Note:  05/12/2022 1:51 PM  Kara Bryant  has presented today for surgery, with the diagnosis of Diverticulitis.  The various methods of treatment have been discussed with the patient and family. After consideration of risks, benefits and other options for treatment, the patient has consented to  Procedure(s): COLONOSCOPY WITH PROPOFOL (N/A) as a surgical intervention.  The patient's history has been reviewed, patient examined, no change in status, stable for surgery.  I have reviewed the patient's chart and labs.  Questions were answered to the patient's satisfaction.     [name]  Ok to proceed with colonoscopy

## 2022-05-12 NOTE — Anesthesia Postprocedure Evaluation (Signed)
Anesthesia Post Note  Patient: Kara Bryant  Procedure(s) Performed: COLONOSCOPY WITH PROPOFOL  Patient location during evaluation: Endoscopy Anesthesia Type: General Level of consciousness: awake and alert Pain management: pain level controlled Vital Signs Assessment: post-procedure vital signs reviewed and stable Respiratory status: spontaneous breathing, nonlabored ventilation, respiratory function stable and patient connected to nasal cannula oxygen Cardiovascular status: blood pressure returned to baseline and stable Postop Assessment: no apparent nausea or vomiting Anesthetic complications: no   No notable events documented.   Last Vitals:  Vitals:   05/12/22 1427 05/12/22 1437  BP: (!) 136/92 132/75  Pulse: 69 64  Resp: 17 14  Temp:    SpO2: 100% 100%    Last Pain:  Vitals:   05/12/22 1437  TempSrc:   PainSc: 0-No pain                 Precious Haws Revecca Nachtigal

## 2022-05-15 ENCOUNTER — Encounter: Payer: Self-pay | Admitting: Gastroenterology

## 2022-05-16 LAB — SURGICAL PATHOLOGY

## 2023-02-09 ENCOUNTER — Other Ambulatory Visit: Payer: Self-pay | Admitting: Internal Medicine

## 2023-02-09 DIAGNOSIS — Z1231 Encounter for screening mammogram for malignant neoplasm of breast: Secondary | ICD-10-CM

## 2023-03-06 ENCOUNTER — Ambulatory Visit
Admission: RE | Admit: 2023-03-06 | Discharge: 2023-03-06 | Disposition: A | Discharge status: Home / Self Care | Payer: Medicare HMO | Source: Ambulatory Visit | Attending: Internal Medicine | Admitting: Internal Medicine

## 2023-03-06 DIAGNOSIS — Z1231 Encounter for screening mammogram for malignant neoplasm of breast: Secondary | ICD-10-CM | POA: Diagnosis not present

## 2023-12-13 ENCOUNTER — Encounter: Payer: Self-pay | Admitting: *Deleted

## 2023-12-24 ENCOUNTER — Encounter: Admission: RE | Payer: Self-pay | Source: Home / Self Care

## 2023-12-24 ENCOUNTER — Ambulatory Visit: Admission: RE | Admit: 2023-12-24 | Payer: Medicare HMO | Source: Home / Self Care

## 2023-12-24 HISTORY — DX: Diverticulosis of intestine, part unspecified, without perforation or abscess without bleeding: K57.90

## 2023-12-24 HISTORY — DX: Allergic rhinitis, unspecified: J30.9

## 2023-12-24 HISTORY — DX: Hypothyroidism, unspecified: E03.9

## 2023-12-24 SURGERY — COLONOSCOPY WITH PROPOFOL
Anesthesia: General

## 2024-02-26 ENCOUNTER — Encounter: Payer: Self-pay | Admitting: Internal Medicine

## 2024-02-26 DIAGNOSIS — Z1231 Encounter for screening mammogram for malignant neoplasm of breast: Secondary | ICD-10-CM

## 2024-02-29 ENCOUNTER — Other Ambulatory Visit: Payer: Self-pay | Admitting: Internal Medicine

## 2024-02-29 DIAGNOSIS — N644 Mastodynia: Secondary | ICD-10-CM

## 2024-03-05 ENCOUNTER — Ambulatory Visit
Admission: RE | Admit: 2024-03-05 | Discharge: 2024-03-05 | Disposition: A | Discharge status: Home / Self Care | Source: Ambulatory Visit | Attending: Internal Medicine | Admitting: Internal Medicine

## 2024-03-05 DIAGNOSIS — N644 Mastodynia: Secondary | ICD-10-CM | POA: Diagnosis present

## 2024-06-17 ENCOUNTER — Other Ambulatory Visit: Payer: Self-pay | Admitting: Medical Genetics

## 2024-08-29 ENCOUNTER — Other Ambulatory Visit: Payer: Self-pay | Admitting: Medical Genetics

## 2024-08-29 DIAGNOSIS — Z006 Encounter for examination for normal comparison and control in clinical research program: Secondary | ICD-10-CM
